# Patient Record
Sex: Male | Born: 1960 | Race: White | Hispanic: No | Marital: Married | Smoking: Never smoker
Health system: Southern US, Community
[De-identification: ages and names within clinical notes are randomized; demographics above are authoritative.]

## PROBLEM LIST (undated history)

## (undated) DIAGNOSIS — Z8719 Personal history of other diseases of the digestive system: Secondary | ICD-10-CM

## (undated) DIAGNOSIS — Z8739 Personal history of other diseases of the musculoskeletal system and connective tissue: Secondary | ICD-10-CM

## (undated) DIAGNOSIS — R51 Headache: Secondary | ICD-10-CM

## (undated) DIAGNOSIS — M1990 Unspecified osteoarthritis, unspecified site: Secondary | ICD-10-CM

## (undated) DIAGNOSIS — Z8711 Personal history of peptic ulcer disease: Secondary | ICD-10-CM

## (undated) HISTORY — PX: SHOULDER SURGERY: SHX246

---

## 2013-07-18 ENCOUNTER — Ambulatory Visit: Payer: Self-pay | Admitting: General Practice

## 2013-08-21 ENCOUNTER — Other Ambulatory Visit (HOSPITAL_COMMUNITY): Payer: Self-pay | Admitting: Orthopaedic Surgery

## 2013-08-22 ENCOUNTER — Encounter (HOSPITAL_COMMUNITY): Payer: Self-pay | Admitting: Pharmacy Technician

## 2013-08-22 ENCOUNTER — Other Ambulatory Visit (HOSPITAL_COMMUNITY): Payer: Self-pay | Admitting: *Deleted

## 2013-08-23 ENCOUNTER — Encounter (HOSPITAL_COMMUNITY): Payer: Self-pay

## 2013-08-23 ENCOUNTER — Encounter (HOSPITAL_COMMUNITY)
Admission: RE | Admit: 2013-08-23 | Discharge: 2013-08-23 | Disposition: A | Discharge status: Home / Self Care | Payer: No Typology Code available for payment source | Source: Ambulatory Visit | Attending: Orthopaedic Surgery | Admitting: Orthopaedic Surgery

## 2013-08-23 DIAGNOSIS — Z01812 Encounter for preprocedural laboratory examination: Secondary | ICD-10-CM | POA: Insufficient documentation

## 2013-08-23 DIAGNOSIS — Z01818 Encounter for other preprocedural examination: Secondary | ICD-10-CM | POA: Insufficient documentation

## 2013-08-23 HISTORY — DX: Personal history of other diseases of the musculoskeletal system and connective tissue: Z87.39

## 2013-08-23 HISTORY — DX: Headache: R51

## 2013-08-23 HISTORY — DX: Personal history of peptic ulcer disease: Z87.11

## 2013-08-23 HISTORY — DX: Unspecified osteoarthritis, unspecified site: M19.90

## 2013-08-23 HISTORY — DX: Personal history of other diseases of the digestive system: Z87.19

## 2013-08-23 LAB — URINALYSIS, ROUTINE W REFLEX MICROSCOPIC
Glucose, UA: NEGATIVE mg/dL
Leukocytes, UA: NEGATIVE
Nitrite: NEGATIVE
Protein, ur: NEGATIVE mg/dL

## 2013-08-23 LAB — ABO/RH: ABO/RH(D): A POS

## 2013-08-23 LAB — BASIC METABOLIC PANEL
GFR calc Af Amer: >90 mL/min (ref >90)
GFR calc non Af Amer: >90 mL/min (ref >90)
Potassium: 4.3 mEq/L (ref 3.5–5.1)
Sodium: 135 mEq/L (ref 135–145)

## 2013-08-23 LAB — CBC
Hemoglobin: 15.5 g/dL (ref 13.0–17.0)
MCHC: 35.1 g/dL (ref 30.0–36.0)
RBC: 5.3 MIL/uL (ref 4.22–5.81)

## 2013-08-23 LAB — APTT: aPTT: 29 seconds (ref 24–37)

## 2013-08-23 LAB — SURGICAL PCR SCREEN: MRSA, PCR: NEGATIVE

## 2013-08-23 LAB — PROTIME-INR: Prothrombin Time: 11.4 seconds — ABNORMAL LOW (ref 11.6–15.2)

## 2013-08-23 NOTE — Patient Instructions (Signed)
Jeff [name]  08/23/2013                           YOUR PROCEDURE IS SCHEDULED ON: 9/ 26/14               PLEASE REPORT TO SHORT STAY CENTER AT :  9:30AM               CALL THIS NUMBER IF ANY PROBLEMS THE DAY OF SURGERY :               [phone]                      REMEMBER:   Do not eat food or drink liquids AFTER MIDNIGHT  May have clear liquids UNTIL 6 HOURS BEFORE SURGERY (6:00 AM)  Clear liquids include soda, tea, black coffee, apple or grape juice, broth.  Take these medicines the morning of surgery with A SIP OF WATER:  NONE   Do not wear jewelry, make-up   Do not wear lotions, powders, or perfumes.   Do not shave legs or underarms 12 hrs. before surgery (men may shave face)  Do not bring valuables to the hospital.  Contacts, dentures or bridgework may not be worn into surgery.  Leave suitcase in the car. After surgery it may be brought to your room.  For patients admitted to the hospital more than one night, checkout time is 11:00                          The day of discharge.   Patients discharged the day of surgery will not be allowed to drive home                             If going home same day of surgery, must have someone stay with you first                           24 hrs at home and arrange for some one to drive you home from hospital.    Special Instructions:   Please read over the following fact sheets that you were given:               1. MRSA  INFORMATION                      2. [place] [facility] PREPARING FOR SURGERY SHEET                                                X_____________________________________________________________________        Failure to follow these instructions may result in cancellation of your surgery

## 2013-08-24 NOTE — Progress Notes (Signed)
Spoke with Carollee Herter at Dr. Alben Spittle office concerning +PCR screen . She will follow up. Also informed PT

## 2013-08-31 ENCOUNTER — Encounter (HOSPITAL_COMMUNITY): Payer: Self-pay | Admitting: *Deleted

## 2013-08-31 ENCOUNTER — Ambulatory Visit (HOSPITAL_COMMUNITY): Payer: No Typology Code available for payment source

## 2013-08-31 ENCOUNTER — Ambulatory Visit (HOSPITAL_COMMUNITY): Payer: No Typology Code available for payment source | Admitting: Anesthesiology

## 2013-08-31 ENCOUNTER — Encounter (HOSPITAL_COMMUNITY): Payer: Self-pay | Admitting: Anesthesiology

## 2013-08-31 ENCOUNTER — Inpatient Hospital Stay (HOSPITAL_COMMUNITY): Payer: No Typology Code available for payment source

## 2013-08-31 ENCOUNTER — Inpatient Hospital Stay (HOSPITAL_COMMUNITY)
Admission: RE | Admit: 2013-08-31 | Discharge: 2013-09-02 | DRG: 470 | Disposition: A | Discharge status: Home Health | Payer: No Typology Code available for payment source | Source: Ambulatory Visit | Attending: Orthopaedic Surgery | Admitting: Orthopaedic Surgery

## 2013-08-31 ENCOUNTER — Encounter (HOSPITAL_COMMUNITY)
Admission: RE | Disposition: A | Discharge status: Home Health | Payer: Self-pay | Source: Ambulatory Visit | Attending: Orthopaedic Surgery

## 2013-08-31 DIAGNOSIS — Z01812 Encounter for preprocedural laboratory examination: Secondary | ICD-10-CM

## 2013-08-31 DIAGNOSIS — M9190 Juvenile osteochondrosis of hip and pelvis, unspecified, unspecified leg: Secondary | ICD-10-CM | POA: Diagnosis present

## 2013-08-31 DIAGNOSIS — M169 Osteoarthritis of hip, unspecified: Secondary | ICD-10-CM

## 2013-08-31 DIAGNOSIS — M1610 Unilateral primary osteoarthritis, unspecified hip: Principal | ICD-10-CM | POA: Diagnosis present

## 2013-08-31 HISTORY — PX: TOTAL HIP ARTHROPLASTY: SHX124

## 2013-08-31 LAB — TYPE AND SCREEN: ABO/RH(D): A POS

## 2013-08-31 SURGERY — ARTHROPLASTY, HIP, TOTAL, ANTERIOR APPROACH
Anesthesia: Spinal | Site: Hip | Laterality: Left | Wound class: Clean

## 2013-08-31 MED ORDER — HYDROMORPHONE HCL PF 1 MG/ML IJ SOLN
1.0000 mg | INTRAMUSCULAR | Status: DC | PRN
  Administered 2013-08-31 (×2): 1 mg via INTRAVENOUS
  Filled 2013-08-31 (×2): qty 1

## 2013-08-31 MED ORDER — ACETAMINOPHEN 650 MG RE SUPP: 650.0000 mg | Freq: Four times a day (QID) | RECTAL | Status: DC | PRN

## 2013-08-31 MED ORDER — METHOCARBAMOL 100 MG/ML IJ SOLN
500.0000 mg | Freq: Four times a day (QID) | INTRAVENOUS | Status: DC | PRN
  Administered 2013-08-31: 500 mg via INTRAVENOUS
  Filled 2013-08-31: qty 5

## 2013-08-31 MED ORDER — ONDANSETRON HCL 4 MG PO TABS: 4.0000 mg | ORAL_TABLET | Freq: Four times a day (QID) | ORAL | Status: DC | PRN

## 2013-08-31 MED ORDER — ACETAMINOPHEN 500 MG PO TABS
1000.0000 mg | ORAL_TABLET | Freq: Once | ORAL | Status: AC
  Administered 2013-08-31: 1000 mg via ORAL

## 2013-08-31 MED ORDER — CELECOXIB 200 MG PO CAPS
200.0000 mg | ORAL_CAPSULE | Freq: Once | ORAL | Status: AC
  Administered 2013-08-31: 200 mg via ORAL
  Filled 2013-08-31: qty 1

## 2013-08-31 MED ORDER — CEFAZOLIN SODIUM-DEXTROSE 2-3 GM-% IV SOLR
INTRAVENOUS | Status: AC
  Filled 2013-08-31: qty 50

## 2013-08-31 MED ORDER — DOCUSATE SODIUM 100 MG PO CAPS
100.0000 mg | ORAL_CAPSULE | Freq: Two times a day (BID) | ORAL | Status: DC
  Administered 2013-08-31 – 2013-09-02 (×4): 100 mg via ORAL

## 2013-08-31 MED ORDER — METHOCARBAMOL 500 MG PO TABS
500.0000 mg | ORAL_TABLET | Freq: Four times a day (QID) | ORAL | Status: DC | PRN
  Administered 2013-08-31 – 2013-09-02 (×5): 500 mg via ORAL
  Filled 2013-08-31 (×5): qty 1

## 2013-08-31 MED ORDER — PROPOFOL INFUSION 10 MG/ML OPTIME
INTRAVENOUS | Status: DC | PRN
  Administered 2013-08-31: 75 ug/kg/min via INTRAVENOUS

## 2013-08-31 MED ORDER — PROMETHAZINE HCL 25 MG/ML IJ SOLN: 6.2500 mg | INTRAMUSCULAR | Status: DC | PRN

## 2013-08-31 MED ORDER — PHENYLEPHRINE HCL 10 MG/ML IJ SOLN
INTRAMUSCULAR | Status: DC | PRN
  Administered 2013-08-31: 40 ug via INTRAVENOUS

## 2013-08-31 MED ORDER — 0.9 % SODIUM CHLORIDE (POUR BTL) OPTIME
TOPICAL | Status: DC | PRN
  Administered 2013-08-31: 1000 mL

## 2013-08-31 MED ORDER — MORPHINE SULFATE 2 MG/ML IJ SOLN
2.0000 mg | INTRAMUSCULAR | Status: DC | PRN
  Administered 2013-09-01: 4 mg via INTRAVENOUS
  Administered 2013-09-01: 2 mg via INTRAVENOUS
  Administered 2013-09-01: 02:00:00 4 mg via INTRAVENOUS
  Administered 2013-09-01: 2 mg via INTRAVENOUS
  Administered 2013-09-01: 15:00:00 4 mg via INTRAVENOUS
  Filled 2013-08-31 (×2): qty 2
  Filled 2013-08-31: qty 1
  Filled 2013-08-31 (×2): qty 2
  Filled 2013-08-31: qty 1

## 2013-08-31 MED ORDER — CEFAZOLIN SODIUM 1-5 GM-% IV SOLN
1.0000 g | Freq: Four times a day (QID) | INTRAVENOUS | Status: AC
  Administered 2013-08-31 – 2013-09-01 (×2): 1 g via INTRAVENOUS
  Filled 2013-08-31 (×2): qty 50

## 2013-08-31 MED ORDER — METOCLOPRAMIDE HCL 10 MG PO TABS: 5.0000 mg | ORAL_TABLET | Freq: Three times a day (TID) | ORAL | Status: DC | PRN

## 2013-08-31 MED ORDER — ASPIRIN EC 325 MG PO TBEC
325.0000 mg | DELAYED_RELEASE_TABLET | Freq: Two times a day (BID) | ORAL | Status: DC
  Administered 2013-09-01 – 2013-09-02 (×3): 325 mg via ORAL
  Filled 2013-08-31 (×5): qty 1

## 2013-08-31 MED ORDER — METOCLOPRAMIDE HCL 5 MG/ML IJ SOLN
5.0000 mg | Freq: Three times a day (TID) | INTRAMUSCULAR | Status: DC | PRN
  Administered 2013-08-31 – 2013-09-01 (×2): 10 mg via INTRAVENOUS
  Filled 2013-08-31 (×2): qty 2

## 2013-08-31 MED ORDER — VITAMIN D3 25 MCG (1000 UNIT) PO TABS
1000.0000 [IU] | ORAL_TABLET | Freq: Every day | ORAL | Status: DC
  Administered 2013-08-31 – 2013-09-02 (×3): 1000 [IU] via ORAL
  Filled 2013-08-31 (×3): qty 1

## 2013-08-31 MED ORDER — OXYCODONE HCL 5 MG PO TABS
5.0000 mg | ORAL_TABLET | ORAL | Status: DC | PRN
  Administered 2013-08-31 – 2013-09-02 (×9): 10 mg via ORAL
  Filled 2013-08-31 (×9): qty 2

## 2013-08-31 MED ORDER — CEFAZOLIN SODIUM-DEXTROSE 2-3 GM-% IV SOLR
2.0000 g | INTRAVENOUS | Status: AC
  Administered 2013-08-31: 2 g via INTRAVENOUS

## 2013-08-31 MED ORDER — OXYCODONE HCL 5 MG PO TABS: 5.0000 mg | ORAL_TABLET | Freq: Once | ORAL | Status: DC | PRN

## 2013-08-31 MED ORDER — DIPHENHYDRAMINE HCL 12.5 MG/5ML PO ELIX: 12.5000 mg | ORAL_SOLUTION | ORAL | Status: DC | PRN

## 2013-08-31 MED ORDER — SODIUM CHLORIDE 0.9 % IV SOLN
INTRAVENOUS | Status: DC
  Administered 2013-08-31 – 2013-09-01 (×2): via INTRAVENOUS

## 2013-08-31 MED ORDER — MIDAZOLAM HCL 5 MG/5ML IJ SOLN
INTRAMUSCULAR | Status: DC | PRN
  Administered 2013-08-31: 2 mg via INTRAVENOUS

## 2013-08-31 MED ORDER — ACETAMINOPHEN 500 MG PO TABS
ORAL_TABLET | ORAL | Status: AC
  Filled 2013-08-31: qty 2

## 2013-08-31 MED ORDER — ONDANSETRON HCL 4 MG/2ML IJ SOLN
4.0000 mg | Freq: Four times a day (QID) | INTRAMUSCULAR | Status: DC | PRN
  Administered 2013-08-31 – 2013-09-01 (×3): 4 mg via INTRAVENOUS
  Filled 2013-08-31 (×3): qty 2

## 2013-08-31 MED ORDER — POLYETHYLENE GLYCOL 3350 17 G PO PACK: 17.0000 g | PACK | Freq: Every day | ORAL | Status: DC | PRN

## 2013-08-31 MED ORDER — ACETAMINOPHEN 325 MG PO TABS: 650.0000 mg | ORAL_TABLET | Freq: Four times a day (QID) | ORAL | Status: DC | PRN

## 2013-08-31 MED ORDER — TRANEXAMIC ACID 100 MG/ML IV SOLN
1000.0000 mg | INTRAVENOUS | Status: AC
  Administered 2013-08-31: 1000 mg via INTRAVENOUS
  Filled 2013-08-31: qty 10

## 2013-08-31 MED ORDER — OXYCODONE HCL 5 MG/5ML PO SOLN
5.0000 mg | Freq: Once | ORAL | Status: DC | PRN
  Filled 2013-08-31: qty 5

## 2013-08-31 MED ORDER — ALUM & MAG HYDROXIDE-SIMETH 200-200-20 MG/5ML PO SUSP: 30.0000 mL | ORAL | Status: DC | PRN

## 2013-08-31 MED ORDER — MENTHOL 3 MG MT LOZG: 1.0000 | LOZENGE | OROMUCOSAL | Status: DC | PRN

## 2013-08-31 MED ORDER — BUPIVACAINE HCL (PF) 0.5 % IJ SOLN
INTRAMUSCULAR | Status: AC
  Filled 2013-08-31: qty 30

## 2013-08-31 MED ORDER — CELECOXIB 200 MG PO CAPS
ORAL_CAPSULE | ORAL | Status: AC
  Filled 2013-08-31: qty 1

## 2013-08-31 MED ORDER — PHENOL 1.4 % MT LIQD: 1.0000 | OROMUCOSAL | Status: DC | PRN

## 2013-08-31 MED ORDER — ADULT MULTIVITAMIN W/MINERALS CH
1.0000 | ORAL_TABLET | Freq: Every day | ORAL | Status: DC
  Administered 2013-08-31 – 2013-09-02 (×3): 1 via ORAL
  Filled 2013-08-31 (×3): qty 1

## 2013-08-31 MED ORDER — PROPOFOL 10 MG/ML IV BOLUS
INTRAVENOUS | Status: DC | PRN
  Administered 2013-08-31 (×2): 20 mg via INTRAVENOUS

## 2013-08-31 MED ORDER — GABAPENTIN 100 MG PO CAPS
100.0000 mg | ORAL_CAPSULE | Freq: Every day | ORAL | Status: DC
  Administered 2013-08-31 – 2013-09-01 (×2): 100 mg via ORAL
  Filled 2013-08-31 (×3): qty 1

## 2013-08-31 MED ORDER — HYDROMORPHONE HCL PF 1 MG/ML IJ SOLN
INTRAMUSCULAR | Status: AC
  Filled 2013-08-31: qty 1

## 2013-08-31 MED ORDER — FENTANYL CITRATE 0.05 MG/ML IJ SOLN
INTRAMUSCULAR | Status: DC | PRN
  Administered 2013-08-31: 50 ug via INTRAVENOUS

## 2013-08-31 MED ORDER — PHENYLEPHRINE HCL 10 MG/ML IJ SOLN
10.0000 mg | INTRAVENOUS | Status: DC | PRN
  Administered 2013-08-31: 40 ug/min via INTRAVENOUS

## 2013-08-31 MED ORDER — HYDROMORPHONE HCL PF 1 MG/ML IJ SOLN
0.2500 mg | INTRAMUSCULAR | Status: DC | PRN
  Administered 2013-08-31: 0.5 mg via INTRAVENOUS

## 2013-08-31 MED ORDER — BUPIVACAINE HCL (PF) 0.5 % IJ SOLN
INTRAMUSCULAR | Status: DC | PRN
  Administered 2013-08-31: 3 mL

## 2013-08-31 MED ORDER — LACTATED RINGERS IV SOLN
INTRAVENOUS | Status: DC
  Administered 2013-08-31: 13:00:00 via INTRAVENOUS
  Administered 2013-08-31: 1000 mL via INTRAVENOUS

## 2013-08-31 MED ORDER — ZOLPIDEM TARTRATE 5 MG PO TABS: 5.0000 mg | ORAL_TABLET | Freq: Every evening | ORAL | Status: DC | PRN

## 2013-08-31 MED ORDER — MEPERIDINE HCL 50 MG/ML IJ SOLN: 6.2500 mg | INTRAMUSCULAR | Status: DC | PRN

## 2013-08-31 SURGICAL SUPPLY — 41 items
BAG ZIPLOCK 12X15 (MISCELLANEOUS) ×4 IMPLANT
BLADE SAW SGTL 18X1.27X75 (BLADE) ×2 IMPLANT
CAPT HIP PF COP ×2 IMPLANT
CELLS DAT CNTRL 66122 CELL SVR (MISCELLANEOUS) ×1 IMPLANT
CLOTH BEACON ORANGE TIMEOUT ST (SAFETY) ×2 IMPLANT
DERMABOND ADVANCED (GAUZE/BANDAGES/DRESSINGS) ×1
DERMABOND ADVANCED .7 DNX12 (GAUZE/BANDAGES/DRESSINGS) ×1 IMPLANT
DRAPE C-ARM 42X120 X-RAY (DRAPES) ×2 IMPLANT
DRAPE STERI IOBAN 125X83 (DRAPES) ×2 IMPLANT
DRAPE U-SHAPE 47X51 STRL (DRAPES) ×6 IMPLANT
DRSG AQUACEL AG ADV 3.5X10 (GAUZE/BANDAGES/DRESSINGS) ×2 IMPLANT
DRSG TEGADERM 2-3/8X2-3/4 SM (GAUZE/BANDAGES/DRESSINGS) ×2 IMPLANT
DRSG XEROFORM 1X8 (GAUZE/BANDAGES/DRESSINGS) ×2 IMPLANT
DURAPREP 26ML APPLICATOR (WOUND CARE) ×2 IMPLANT
ELECT BLADE TIP CTD 4 INCH (ELECTRODE) ×2 IMPLANT
ELECT REM PT RETURN 9FT ADLT (ELECTROSURGICAL) ×2
ELECTRODE REM PT RTRN 9FT ADLT (ELECTROSURGICAL) ×1 IMPLANT
FACESHIELD LNG OPTICON STERILE (SAFETY) ×8 IMPLANT
GAUZE SPONGE 2X2 8PLY STRL LF (GAUZE/BANDAGES/DRESSINGS) ×1 IMPLANT
GLOVE BIO SURGEON STRL SZ7.5 (GLOVE) ×2 IMPLANT
GLOVE BIOGEL PI IND STRL 8 (GLOVE) ×2 IMPLANT
GLOVE BIOGEL PI INDICATOR 8 (GLOVE) ×2
GLOVE ECLIPSE 8.0 STRL XLNG CF (GLOVE) ×2 IMPLANT
GOWN STRL REIN XL XLG (GOWN DISPOSABLE) ×4 IMPLANT
HANDPIECE INTERPULSE COAX TIP (DISPOSABLE) ×1
KIT BASIN OR (CUSTOM PROCEDURE TRAY) ×2 IMPLANT
PACK TOTAL JOINT (CUSTOM PROCEDURE TRAY) ×2 IMPLANT
PADDING CAST COTTON 6X4 STRL (CAST SUPPLIES) ×2 IMPLANT
RTRCTR WOUND ALEXIS 18CM MED (MISCELLANEOUS) ×2
SET HNDPC FAN SPRY TIP SCT (DISPOSABLE) ×1 IMPLANT
SPONGE GAUZE 2X2 STER 10/PKG (GAUZE/BANDAGES/DRESSINGS) ×1
SUT ETHIBOND NAB CT1 #1 30IN (SUTURE) ×4 IMPLANT
SUT ETHILON 3 0 PS 1 (SUTURE) ×2 IMPLANT
SUT MNCRL AB 4-0 PS2 18 (SUTURE) ×2 IMPLANT
SUT VIC AB 0 CT1 36 (SUTURE) ×2 IMPLANT
SUT VIC AB 1 CT1 36 (SUTURE) ×4 IMPLANT
SUT VIC AB 2-0 CT1 27 (SUTURE) ×2
SUT VIC AB 2-0 CT1 TAPERPNT 27 (SUTURE) ×2 IMPLANT
TOWEL OR 17X26 10 PK STRL BLUE (TOWEL DISPOSABLE) ×2 IMPLANT
TOWEL OR NON WOVEN STRL DISP B (DISPOSABLE) ×2 IMPLANT
TRAY FOLEY CATH 14FRSI W/METER (CATHETERS) ×2 IMPLANT

## 2013-08-31 NOTE — H&P (Signed)
TOTAL HIP ADMISSION H&P  Patient is admitted for left total hip arthroplasty.  Subjective:  Chief Complaint: left hip pain  HPI: Jeff [name], 52 y.o. male, has a history of pain and functional disability in the left hip(s) due to arthritis and patient has failed non-surgical conservative treatments for greater than 12 weeks to include NSAID's and/or analgesics, flexibility and strengthening excercises, weight reduction as appropriate and activity modification.  Onset of symptoms was gradual starting 5 years ago with gradually worsening course since that time.The patient noted no past surgery on the left hip(s).  Patient currently rates pain in the left hip at 8 out of 10 with activity. Patient has night pain, worsening of pain with activity and weight bearing, trendelenberg gait, pain that interfers with activities of daily living and pain with passive range of motion. Patient has evidence of subchondral sclerosis, periarticular osteophytes and joint space narrowing by imaging studies. This condition presents safety issues increasing the risk of falls.  There is no current active infection.  Patient Active Problem List   Diagnosis Date Noted  . Degenerative arthritis of left hip 08/31/2013   Past Medical History  Diagnosis Date  . Headache(784.0)   . Arthritis   . Hx of gout   . History of stomach ulcers     AGE 7    Past Surgical History  Procedure Laterality Date  . Shoulder surgery  28 YRS AGO    No prescriptions prior to admission   Allergies  Allergen Reactions  . Ibuprofen Itching    History  Substance Use Topics  . Smoking status: Never Smoker   . Smokeless tobacco: Not on file  . Alcohol Use: Yes     Comment: OCCASIONAL    No family history on file.   Review of Systems  Musculoskeletal: Positive for joint pain.  All other systems reviewed and are negative.    Objective:  Physical Exam  Constitutional: He is oriented to person, place, and time. He appears  well-developed and well-nourished.  HENT:  Head: Normocephalic and atraumatic.  Eyes: EOM are normal. Pupils are equal, round, and reactive to light.  Neck: Normal range of motion. Neck supple.  Cardiovascular: Normal rate and regular rhythm.   Respiratory: Effort normal and breath sounds normal.  GI: Soft. Bowel sounds are normal.  Musculoskeletal:       Left hip: He exhibits decreased range of motion, decreased strength and bony tenderness.  Neurological: He is alert and oriented to person, place, and time.  Skin: Skin is warm and dry.  Psychiatric: He has a normal mood and affect.    Vital signs in last 24 hours:    Labs:   There is no height or weight on file to calculate BMI.   Imaging Review Plain radiographs demonstrate severe degenerative joint disease of the left hip(s). The bone quality appears to be good for age and reported activity level.  Assessment/Plan:  End stage arthritis, left hip(s)  The patient history, physical examination, clinical judgement of the provider and imaging studies are consistent with end stage degenerative joint disease of the left hip(s) and total hip arthroplasty is deemed medically necessary. The treatment options including medical management, injection therapy, arthroscopy and arthroplasty were discussed at length. The risks and benefits of total hip arthroplasty were presented and reviewed. The risks due to aseptic loosening, infection, stiffness, dislocation/subluxation,  thromboembolic complications and other imponderables were discussed.  The patient acknowledged the explanation, agreed to proceed with the plan and consent  was signed. Patient is being admitted for inpatient treatment for surgery, pain control, PT, OT, prophylactic antibiotics, VTE prophylaxis, progressive ambulation and ADL's and discharge planning.The patient is planning to be discharged home with home health services

## 2013-08-31 NOTE — Anesthesia Procedure Notes (Addendum)
Spinal  Patient location during procedure: OR Start time: 08/31/2013 12:03 PM End time: 08/31/2013 12:07 PM Staffing CRNA/Resident: Paris Lore Performed by: resident/CRNA  Preanesthetic Checklist Completed: patient identified, site marked, surgical consent, pre-op evaluation, timeout performed, IV checked, risks and benefits discussed and monitors and equipment checked Spinal Block Patient position: sitting Prep: ChloraPrep Patient monitoring: heart rate, continuous pulse ox and blood pressure Approach: right paramedian Location: L2-3 Injection technique: single-shot Needle Needle type: Sprotte  Needle gauge: 24 G Needle length: 9 cm Needle insertion depth: 5 cm Assessment Sensory level: T4 Additional Notes Expiration date of kit checked and confirmed. Patient tolerated procedure well, without complications.

## 2013-08-31 NOTE — Transfer of Care (Signed)
Immediate Anesthesia Transfer of Care Note  Patient: Jeff [name]  Procedure(s) Performed: Procedure(s) (LRB): LEFT TOTAL HIP ARTHROPLASTY ANTERIOR APPROACH (Left)  Patient Location: PACU  Anesthesia Type: Spinal  Level of Consciousness: sedated, patient cooperative and responds to stimulation  Airway & Oxygen Therapy: Patient Spontanous Breathing and Patient connected to face mask oxgen  Post-op Assessment: Report given to PACU RN and Post -op [name] signs reviewed and stable  Post [name] signs: Reviewed and stable T-12 level on exam pt denied pain on assessment.   Complications: No apparent anesthesia complications

## 2013-08-31 NOTE — Brief Op Note (Signed)
08/31/2013  2:01 PM  PATIENT:  Jeff [name]  52 y.o. male  PRE-OPERATIVE DIAGNOSIS:  Left hip osteoarthritis  POST-OPERATIVE DIAGNOSIS:  Left hip osteoarthritis  PROCEDURE:  Procedure(s): LEFT TOTAL HIP ARTHROPLASTY ANTERIOR APPROACH (Left)  SURGEON:  Surgeon(s) and Role:    * Kathryne Hitch, MD - Primary  PHYSICIAN ASSISTANT: Rexene Edison, PA-C  ANESTHESIA:   spinal  EBL:  Total I/O In: 1000 [I.V.:1000] Out: 300 [Urine:100; Blood:200]  BLOOD ADMINISTERED:none  DRAINS:  Medium hemovac  LOCAL MEDICATIONS USED:  NONE  SPECIMEN:  No Specimen  DISPOSITION OF SPECIMEN:  N/A  COUNTS:  YES  TOURNIQUET:  * No tourniquets in log *  DICTATION: .Other Dictation: Dictation Number [id number]  PLAN OF CARE: Admit to inpatient   PATIENT DISPOSITION:  PACU - hemodynamically stable.   Delay start of Pharmacological VTE agent (>24hrs) due to surgical blood loss or risk of bleeding: no

## 2013-08-31 NOTE — Anesthesia Postprocedure Evaluation (Signed)
Anesthesia Post Note  Patient: Jeff [name]  Procedure(s) Performed: Procedure(s) (LRB): LEFT TOTAL HIP ARTHROPLASTY ANTERIOR APPROACH (Left)  Anesthesia type: Spinal  Patient location: PACU  Post pain: Pain level controlled  Post assessment: Post-op Vital signs reviewed  Last Vitals: BP 109/74  Pulse 75  Temp(Src) 35.9 C (Axillary)  Resp 16  Ht 5\' 8"  (1.727 m)  Wt 188 lb 6 oz (85.446 kg)  BMI 28.65 kg/m2  SpO2 97%  Post vital signs: Reviewed  Level of consciousness: sedated  Complications: No apparent anesthesia complications

## 2013-08-31 NOTE — Anesthesia Preprocedure Evaluation (Addendum)
Anesthesia Evaluation  Patient identified by MRN, date of birth, ID band Patient awake    Reviewed: Allergy & Precautions, H&P , NPO status , Patient's Chart, lab work & pertinent test results  Airway Mallampati: II TM Distance: >3 FB Neck ROM: Full    Dental  (+) Dental Advisory Given and Caps   Pulmonary neg pulmonary ROS,  breath sounds clear to auscultation        Cardiovascular negative cardio ROS  Rhythm:Regular Rate:Normal     Neuro/Psych  Headaches, negative psych ROS   GI/Hepatic negative GI ROS, Neg liver ROS,   Endo/Other  negative endocrine ROS  Renal/GU negative Renal ROS     Musculoskeletal negative musculoskeletal ROS (+)   Abdominal   Peds  Hematology negative hematology ROS (+)   Anesthesia Other Findings   Reproductive/Obstetrics                          Anesthesia Physical Anesthesia Plan  ASA: I  Anesthesia Plan: Spinal   Post-op Pain Management:    Induction:   Airway Management Planned:   Additional Equipment:   Intra-op Plan:   Post-operative Plan:   Informed Consent: I have reviewed the patients History and Physical, chart, labs and discussed the procedure including the risks, benefits and alternatives for the proposed anesthesia with the patient or authorized representative who has indicated his/her understanding and acceptance.   Dental advisory given  Plan Discussed with: CRNA  Anesthesia Plan Comments:         Anesthesia Quick Evaluation

## 2013-09-01 LAB — BASIC METABOLIC PANEL
BUN: 10 mg/dL (ref 6–23)
CO2: 26 mEq/L (ref 19–32)
Calcium: 8.7 mg/dL (ref 8.4–10.5)
Chloride: 94 mEq/L — ABNORMAL LOW (ref 96–112)
Creatinine, Ser: 0.73 mg/dL (ref 0.50–1.35)
Glucose, Bld: 159 mg/dL — ABNORMAL HIGH (ref 70–99)
Potassium: 3.9 mEq/L (ref 3.5–5.1)

## 2013-09-01 LAB — CBC
HCT: 35.5 % — ABNORMAL LOW (ref 39.0–52.0)
Hemoglobin: 12.2 g/dL — ABNORMAL LOW (ref 13.0–17.0)
MCV: 83.5 fL (ref 78.0–100.0)
RDW: 12.9 % (ref 11.5–15.5)
WBC: 9.3 10*3/uL (ref 4.0–10.5)

## 2013-09-01 NOTE — Progress Notes (Signed)
OT Cancellation Note and Discharge  Patient Details Name: Jeff [name] MRN: [id number] DOB: 1961/09/04   Cancelled Treatment:    Reason Eval/Treat Not Completed: OT screened, no needs identified, will sign off. Pt direct anterior approach hip, WBAT, has 24/7 family support, has DME.   Evette Georges [phone] 09/01/2013, 11:53 AM

## 2013-09-01 NOTE — Op Note (Signed)
NAMEDREVIN, ORTNER NO.:  [id number]  MEDICAL RECORD NO.:  [id number]  LOCATION:  1609                         FACILITY:  [facility]  PHYSICIAN:  Vanita Panda. 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  PROCEDURE:  Left total hip arthroplasty through direct anterior approach.  IMPLANTS:  DePuy Sector Gription acetabular component size 52 with apex eliminator guide and 1 screw, size 36+ 4 neutral polyethylene liner, size 9 Corail femoral component with standard offset, size 36+ 5 ceramic hip ball.  SURGEON:  Vanita Panda. Magnus Ivan, M.D.  ASSISTANT:  Richardean Canal, PA-C  ANESTHESIA:  Spinal.  ANTIBIOTICS:  2 g IV Ancef.  BLOOD LOSS:  Less than 500 mL.  COMPLICATIONS:  None.  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 He has gotten to where his hip is much shorter on the left than the right.  He has significant limp.  He has daily pain and decrease in activities and at this point, he wished to proceed with a total hip arthroplasty.  I feel confident that we can get this done through a direct anterior approach.  I have explained the risks and benefits of the surgery in detail including the risks of fracture, acute blood loss anemia, nerve and vessel injury, infection, and DVT.  He does wish to proceed with surgery with the goals of decreased pain, increased mobility, and increased quality of life.  PROCEDURE DESCRIPTION:  After informed  consent was obtained, appropriate left hip was marked.  He was brought to the operating room and spinal anesthesia was obtained while he was on the stretcher.  He was then placed supine back on a stretcher.  Foley catheter was placed and then traction boots were placed on both his feet.  Next, he was placed supine on the Hana fracture table with perineal post in place and both legs in inline skeletal traction devices, but no traction applied.  His left operative hip was prepped and draped with DuraPrep and sterile drapes. Time-out was called.  He was identified as correct patient, correct left hip.  We then made an incision just inferior and posterior to the anterior and superior iliac spine and carried this obliquely down the leg.  We dissected down to the tensor fascia lata muscle and the tensor fascia was divided longitudinally.  We then proceeded with a direct anterior approach to the hip.  Curved retractor was placed around the lateral neck with a short neck and 1 underneath the medial neck. I cauterized the lateral femoral circumflex vessels and then opened up hip capsule in a L-type format, retractors placing the within the hip capsule.  I then used  oscillating saw to make my femoral  neck cut barely proximal to the lesser trochanter.  We then completed this with an osteotome and I placed a corkscrew guide in the femoral head and removed the femoral head in its entirety and found a devoid of cartilage.  We then placed a bent Hohmann medially and a Cobra retractor laterally and cleaned the remnants with the acetabular labrum and tissue within the acetabulum.  We then began reaming with medializing and a superior position of the cup to get good bony rim contact.  We reamed in 2 mm increments under direct visualization from size 42 up to size 52 with a last reamer also placed under direct fluoroscopy, so we could obtain our depth of reaming, our inclination and anteversion.  Once  I was pleased with this, I placed the real DePuy Sector Gription acetabular component size 52 and the apex hole eliminator guide, as well as a single screw.  I then turned attention to the femur with the leg externally rotated to 100 degrees, extended and adducted.  I placed a Mueller, retractored medially and Hohmann retractor behind the greater trochanter and used a box cutting osteotome to enter the femoral canal and a rongeur to lateralize.  I then broached with only a size 8 and then a size 9 broach.  We then trialed a standard neck and a 36+ 5 hip ball hoping to gain some length on him because he was about 0.25 inch shorter preop.  We were able to bring the leg back up and overall traction and internal rotation reducing the pelvis and it was stable past 90 degrees of external rotation, 45 degrees of internal rotation with minimal shuck and we are able to accomplish increasing his leg length.  We then dislocated the hip and removed the trial components and placed the real size 9 femoral component, which was Corail from DePuy along with a 36+ 5 ceramic hip ball.  We reduced this back in the acetabulum again it was stable so we copiously irrigated the soft tissue with normal saline solution.  We closed the joint capsule with interrupted #1 Ethibond suture.  I placed a medium Hemovac deep in the arthrotomy and closed the tensor fascia with a running #1 Vicryl suture, followed by 0 Vicryl in the deep tissue, 2-0 Vicryl subcutaneous tissue and staples on the skin, Xeroform.  A well-padded sterile dressing was applied.  He was taken off the Hana table into the recovery room in stable condition.  All final counts were correct.  There were no complications noted.     Vanita Panda. Magnus Ivan, M.D.     CYB/MEDQ  D:  08/31/2013  T:  09/01/2013  Job:  [id number]

## 2013-09-01 NOTE — Plan of Care (Signed)
Problem: Consults Goal: Diagnosis- Total Joint Replacement Outcome: Completed/Met Date Met:  09/01/13 Primary Total Hip LEFT, Anterior

## 2013-09-01 NOTE — Evaluation (Signed)
Physical Therapy Evaluation Patient Details Name: Jeff [name] MRN: [id number] DOB: 17-Feb-1961 Today's Date: 09/01/2013 Time: 4098-1191 PT Time Calculation (min): 18 min  PT Assessment / Plan / Recommendation History of Present Illness  52 yo male s/p L THA-direct anterior. Hx gout  Clinical Impression  On eval, pt required Min assist for mobility-able to ambulate ~75 feet with RW. Anticipate pt will progress well during stay. Recommend HHPT.     PT Assessment  Patient needs continued PT services    Follow Up Recommendations  Home health PT    Does the patient have the potential to tolerate intense rehabilitation      Barriers to Discharge        Equipment Recommendations  None recommended by PT    Recommendations for Other Services OT consult   Frequency 7X/week    Precautions / Restrictions Precautions Precautions: None Restrictions Weight Bearing Restrictions: No LLE Weight Bearing: Weight bearing as tolerated   Pertinent Vitals/Pain L LE 7/10. Ice applied end of session      Mobility  Bed Mobility Bed Mobility: Supine to Sit Supine to Sit: 4: Min assist Details for Bed Mobility Assistance: Assist for L LE Transfers Transfers: Sit to Stand;Stand to Sit Sit to Stand: 4: Min assist;From bed Stand to Sit: 4: Min assist;To chair/3-in-1 Details for Transfer Assistance: assist to rise, stabilize contol descent.  Ambulation/Gait Ambulation/Gait Assistance: 4: Min assist Ambulation Distance (Feet): 75 Feet Assistive device: Rolling walker Ambulation/Gait Assistance Details: slow gait speed. VCs L step length, sequence, safety.  Gait Pattern: Step-to pattern;Decreased step length - left;Antalgic    Exercises     PT Diagnosis: Difficulty walking;Abnormality of gait;Acute pain  PT Problem List: Decreased strength;Decreased range of motion;Decreased activity tolerance;Decreased mobility;Pain;Decreased knowledge of use of DME PT Treatment Interventions: DME  instruction;Gait training;Stair training;Functional mobility training;Therapeutic activities;Therapeutic exercise;Patient/family education     PT Goals(Current goals can be found in the care plan section) Acute Rehab PT Goals Patient Stated Goal: less pain. regain indepdendence PT Goal Formulation: With patient Time For Goal Achievement: 09/08/13 Potential to Achieve Goals: Good  Visit Information  Last PT Received On: 09/01/13 Assistance Needed: +1 History of Present Illness: 52 yo male s/p L THA-direct anterior. Hx gout       Prior Functioning  Home Living Family/patient expects to be discharged to:: Private residence Living Arrangements: Spouse/significant other Available Help at Discharge: Family Type of Home: House Home Access: Stairs to enter [employer] of Steps: 2 Entrance Stairs-Rails: None Home Layout: Two level;Able to live on main level with bedroom/bathroom Home Equipment: Dan Humphreys - 2 wheels;Cane - single point Additional Comments: wife can get [facility] if needed Prior Function Level of Independence: Independent Communication Communication: No difficulties    Cognition  Cognition Arousal/Alertness: Awake/alert Behavior During Therapy: WFL for tasks assessed/performed Overall Cognitive Status: Within Functional Limits for tasks assessed    Extremity/Trunk Assessment Upper Extremity Assessment Upper Extremity Assessment: Overall WFL for tasks assessed Lower Extremity Assessment Lower Extremity Assessment: LLE deficits/detail LLE Deficits / Details: hip flex 2/5, moves ankle well Cervical / Trunk Assessment Cervical / Trunk Assessment: Normal   Balance    End of Session PT - End of Session Equipment Utilized During Treatment: Gait belt Activity Tolerance: Patient tolerated treatment well Patient left: in chair;with call bell/phone within reach;with family/visitor present  GP     Rebeca Alert, MPT Pager: [phone]

## 2013-09-01 NOTE — Progress Notes (Signed)
Subjective: 1 Day Post-Op Procedure(s) (LRB): LEFT TOTAL HIP ARTHROPLASTY ANTERIOR APPROACH (Left) Patient reports pain as moderate.    Objective: Vital signs in last 24 hours: Temp:  [96.6 F (35.9 C)-99.8 F (37.7 C)] 98.6 F (37 C) (09/27 1414) Pulse Rate:  [64-88] 73 (09/27 1414) Resp:  [12-18] 16 (09/27 1414) BP: (107-139)/(62-87) 139/87 mmHg (09/27 1414) SpO2:  [95 %-100 %] 100 % (09/27 1414) Weight:  [85.446 kg (188 lb 6 oz)] 85.446 kg (188 lb 6 oz) (09/26 1900)  Intake/Output from previous day: 09/26 0701 - 09/27 0700 In: 3997.5 [P.O.:480; I.V.:3362.5; IV Piggyback:155] Out: 2205 [Urine:1900; Drains:105; Blood:200] Intake/Output this shift: Total I/O In: 880 [P.O.:580; I.V.:300] Out: -    Recent Labs  09/01/13 0512  HGB 12.2*    Recent Labs  09/01/13 0512  WBC 9.3  RBC 4.25  HCT 35.5*  PLT 212    Recent Labs  09/01/13 0512  NA 132*  K 3.9  CL 94*  CO2 26  BUN 10  CREATININE 0.73  GLUCOSE 159*  CALCIUM 8.7   No results found for this basename: LABPT, INR,  in the last 72 hours  Sensation intact distally Intact pulses distally Dorsiflexion/Plantar flexion intact Incision: dressing C/D/I  Assessment/Plan: 1 Day Post-Op Procedure(s) (LRB): LEFT TOTAL HIP ARTHROPLASTY ANTERIOR APPROACH (Left) Up with therapy  Maxima Skelton Y 09/01/2013, 2:47 PM

## 2013-09-01 NOTE — Care Management Note (Signed)
    Page 1 of 1   09/01/2013     4:49:28 PM   CARE MANAGEMENT NOTE 09/01/2013  Patient:  Jeff [name], Jeff [name]   Account Number:  [id number]  Date Initiated:  09/01/2013  Documentation initiated by:  Lanier Clam  Subjective/Objective Assessment:   7O Y/O M ADMITTED W/OA L HIP     Action/Plan:   FROM HOME   Anticipated DC Date:  09/02/2013   Anticipated DC Plan:  HOME W HOME HEALTH SERVICES      DC Planning Services  CM consult      Choice offered to / List presented to:  C-1 Patient           Status of service:  In process, will continue to follow Medicare Important Message given?   (If response is "NO", the following Medicare IM given date fields will be blank) Date Medicare IM given:   Date Additional Medicare IM given:    Discharge Disposition:    Per UR Regulation:    If discussed at Long Length of Stay Meetings, dates discussed:    Comments:  09/01/13 Adaline Trejos RN,BSN NCM [phone] [facility] REP [facility] TEL[address][phone] ALREADY FOLLOWING.PT-HH.AWAIT FINAL HH ORDERS.

## 2013-09-01 NOTE — Progress Notes (Signed)
Physical Therapy Treatment Patient Details Name: Jeff [name] MRN: [id number] DOB: 1961-03-10 Today's Date: 09/01/2013 Time: 6295-2841 PT Time Calculation (min): 24 min  PT Assessment / Plan / Recommendation  History of Present Illness 52 yo male s/p L THA-direct anterior. Hx gout   PT Comments   Progressing with mobility.   Follow Up Recommendations  Home health PT     Does the patient have the potential to tolerate intense rehabilitation     Barriers to Discharge        Equipment Recommendations  None recommended by PT    Recommendations for Other Services OT consult  Frequency 7X/week   Progress towards PT Goals Progress towards PT goals: Progressing toward goals  Plan Current plan remains appropriate    Precautions / Restrictions Precautions Precautions: None Restrictions Weight Bearing Restrictions: No LLE Weight Bearing: Weight bearing as tolerated   Pertinent Vitals/Pain 7/10 L hip. Ice applied end of session    Mobility  Bed Mobility Bed Mobility: Supine to Sit;Sit to Supine Supine to Sit: 4: Min guard Sit to Supine: 4: Min assist Details for Bed Mobility Assistance: Assist for L LE Transfers Transfers: Sit to Stand;Stand to Sit Sit to Stand: 4: Min guard;From bed Stand to Sit: 4: Min guard;To bed Details for Transfer Assistance: VCs safety,. Ambulation/Gait Ambulation/Gait Assistance: 4: Min guard Ambulation Distance (Feet): 150 Feet Assistive device: Rolling walker Ambulation/Gait Assistance Details: vcs safety, sequencing initially. pt progressed to step through gait pattern.  Gait Pattern: Step-through pattern;Decreased stride length;Decreased step length - left;Antalgic    Exercises Total Joint Exercises Ankle Circles/Pumps: AROM;Both;15 reps;Supine Quad Sets: AROM;Both;15 reps;Supine Heel Slides: AROM;Left;15 reps;Supine Hip ABduction/ADduction: AROM;AAROM;Left;15 reps;Supine   PT Diagnosis: Difficulty walking;Abnormality of gait;Acute pain   PT Problem List: Decreased strength;Decreased range of motion;Decreased activity tolerance;Decreased mobility;Pain;Decreased knowledge of use of DME PT Treatment Interventions: DME instruction;Gait training;Stair training;Functional mobility training;Therapeutic activities;Therapeutic exercise;Patient/family education   PT Goals (current goals can now be found in the care plan section) Acute Rehab PT Goals Patient Stated Goal: less pain. regain indepdendence PT Goal Formulation: With patient Time For Goal Achievement: 09/08/13 Potential to Achieve Goals: Good  Visit Information  Last PT Received On: 09/01/13 Assistance Needed: +1 History of Present Illness: 52 yo male s/p L THA-direct anterior. Hx gout    Subjective Data  Patient Stated Goal: less pain. regain indepdendence   Cognition  Cognition Arousal/Alertness: Awake/alert Behavior During Therapy: WFL for tasks assessed/performed Overall Cognitive Status: Within Functional Limits for tasks assessed    Balance     End of Session PT - End of Session Equipment Utilized During Treatment: Gait belt Activity Tolerance: Patient tolerated treatment well Patient left: in bed;with call bell/phone within reach;with family/visitor present   GP     Rebeca Alert, MPT Pager: [phone]

## 2013-09-02 LAB — CBC
HCT: 35.5 % — ABNORMAL LOW (ref 39.0–52.0)
Hemoglobin: 12.3 g/dL — ABNORMAL LOW (ref 13.0–17.0)
MCH: 29 pg (ref 26.0–34.0)
MCHC: 34.6 g/dL (ref 30.0–36.0)
MCV: 83.7 fL (ref 78.0–100.0)
RDW: 12.9 % (ref 11.5–15.5)
WBC: 9.8 10*3/uL (ref 4.0–10.5)

## 2013-09-02 MED ORDER — ASPIRIN 325 MG PO TBEC: 325.0000 mg | DELAYED_RELEASE_TABLET | Freq: Two times a day (BID) | ORAL | Status: AC

## 2013-09-02 MED ORDER — METHOCARBAMOL 500 MG PO TABS: 500.0000 mg | ORAL_TABLET | Freq: Four times a day (QID) | ORAL | Status: DC | PRN

## 2013-09-02 MED ORDER — OXYCODONE-ACETAMINOPHEN 5-325 MG PO TABS: 1.0000 | ORAL_TABLET | ORAL | Status: DC | PRN

## 2013-09-02 NOTE — Progress Notes (Signed)
Physical Therapy Treatment Patient Details Name: Jeff [name] MRN: [id number] DOB: 1961-11-28 Today's Date: 09/02/2013 Time: 2130-8657 PT Time Calculation (min): 25 min  PT Assessment / Plan / Recommendation  History of Present Illness 52 yo male s/p L THA-direct anterior. Hx gout   PT Comments   Progressing with mobility. Practiced ambulation, stair negotiation, standing exercises. Plan is for d/c home today. Ready from PT standpoint.   Follow Up Recommendations  Home health PT     Does the patient have the potential to tolerate intense rehabilitation     Barriers to Discharge        Equipment Recommendations  None recommended by PT    Recommendations for Other Services OT consult  Frequency 7X/week   Progress towards PT Goals Progress towards PT goals: Progressing toward goals  Plan Current plan remains appropriate    Precautions / Restrictions Precautions Precautions: None Restrictions Weight Bearing Restrictions: No LLE Weight Bearing: Weight bearing as tolerated   Pertinent Vitals/Pain 3/10 L LE. Ice applied end of session    Mobility  Bed Mobility Bed Mobility: Not assessed Details for Bed Mobility Assistance: pt sitting in recliner Transfers Transfers: Sit to Stand;Stand to Sit Sit to Stand: 5: Supervision;From chair/3-in-1 Stand to Sit: 5: Supervision;To chair/3-in-1 Ambulation/Gait Ambulation/Gait Assistance: 5: Supervision Ambulation Distance (Feet): 175 Feet Assistive device: Rolling walker Gait Pattern: Step-through pattern;Antalgic;Decreased stride length Stairs: Yes Stairs Assistance: 4: Min assist Stairs Assistance Details (indicate cue type and reason): VCs safety, technique, sequence. 1 HHA from therapist to provide support. "up with good, down with bad"  Stair Management Technique: Forwards;Step to pattern Number of Stairs: 2    Exercises Total Joint Exercises Hip ABduction/ADduction: AROM;10 reps;Left;Standing Long Arc Quad: AROM;10  reps;Seated;Left Knee Flexion: AROM;Left;10 reps;Standing Standing Hip Extension: AROM;Left;10 reps;Standing General Exercises - Lower Extremity Hip Flexion/Marching: AROM;Left;10 reps;Standing Heel Raises: AROM;Left;10 reps;Standing   PT Diagnosis:    PT Problem List:   PT Treatment Interventions:     PT Goals (current goals can now be found in the care plan section)    Visit Information  Last PT Received On: 09/02/13 Assistance Needed: +1 History of Present Illness: 52 yo male s/p L THA-direct anterior. Hx gout    Subjective Data      Cognition  Cognition Arousal/Alertness: Awake/alert Behavior During Therapy: WFL for tasks assessed/performed Overall Cognitive Status: Within Functional Limits for tasks assessed    Balance     End of Session PT - End of Session Activity Tolerance: Patient tolerated treatment well Patient left: in chair;with call bell/phone within reach   GP     Rebeca Alert, MPT Pager: [phone]

## 2013-09-02 NOTE — Progress Notes (Signed)
Subjective: 2 Days Post-Op Procedure(s) (LRB): LEFT TOTAL HIP ARTHROPLASTY ANTERIOR APPROACH (Left) Patient reports pain as mild.    Objective: Vital signs in last 24 hours: Temp:  [98.6 F (37 C)-99.8 F (37.7 C)] 99.7 F (37.6 C) (09/28 0500) Pulse Rate:  [73-102] 102 (09/28 0500) Resp:  [16-18] 18 (09/28 0500) BP: (120-139)/(76-87) 120/79 mmHg (09/28 0500) SpO2:  [99 %-100 %] 100 % (09/28 0500)  Intake/Output from previous day: 09/27 0701 - 09/28 0700 In: 1798.9 [P.O.:1020; I.V.:778.9] Out: -  Intake/Output this shift:     Recent Labs  09/01/13 0512 09/02/13 0528  HGB 12.2* 12.3*    Recent Labs  09/01/13 0512 09/02/13 0528  WBC 9.3 9.8  RBC 4.25 4.24  HCT 35.5* 35.5*  PLT 212 216    Recent Labs  09/01/13 0512  NA 132*  K 3.9  CL 94*  CO2 26  BUN 10  CREATININE 0.73  GLUCOSE 159*  CALCIUM 8.7   No results found for this basename: LABPT, INR,  in the last 72 hours  Sensation intact distally Intact pulses distally Dorsiflexion/Plantar flexion intact Incision: dressing C/D/I  Assessment/Plan: 2 Days Post-Op Procedure(s) (LRB): LEFT TOTAL HIP ARTHROPLASTY ANTERIOR APPROACH (Left) Up with therapy Discharge to home today.  BLACKMAN,CHRISTOPHER Y 09/02/2013, 10:10 AM

## 2013-09-02 NOTE — Progress Notes (Signed)
Discharged from floor via w/c, wife with pt. No changes in assessment. Marley Charlot  

## 2013-09-02 NOTE — Discharge Summary (Signed)
Patient ID: Jeff [name] MRN: [id number] DOB/AGE: 01/15/1961 52 y.o.  Admit date: 08/31/2013 Discharge date: 09/02/2013  Admission Diagnoses:  Principal Problem:   Degenerative arthritis of left hip   Discharge Diagnoses:  Same  Past Medical History  Diagnosis Date  . Headache(784.0)   . Arthritis   . Hx of gout   . History of stomach ulcers     AGE 10    Surgeries: Procedure(s): LEFT TOTAL HIP ARTHROPLASTY ANTERIOR APPROACH on 08/31/2013   Consultants:    Discharged Condition: Improved  Hospital Course: Jeff [name] is an 53 y.o. male who was admitted 08/31/2013 for operative treatment ofDegenerative arthritis of hip. Patient has severe unremitting pain that affects sleep, daily activities, and work/hobbies. After pre-op clearance the patient was taken to the operating room on 08/31/2013 and underwent  Procedure(s): LEFT TOTAL HIP ARTHROPLASTY ANTERIOR APPROACH.    Patient was given perioperative antibiotics: Anti-infectives   Start     Dose/Rate Route Frequency Ordered Stop   08/31/13 1800  ceFAZolin (ANCEF) IVPB 1 g/50 mL premix     1 g 100 mL/hr over 30 Minutes Intravenous Every 6 hours 08/31/13 1554 09/01/13 0055   08/31/13 1037  ceFAZolin (ANCEF) IVPB 2 g/50 mL premix     2 g 100 mL/hr over 30 Minutes Intravenous On call to O.R. 08/31/13 1037 08/31/13 1229       Patient was given sequential compression devices, early ambulation, and chemoprophylaxis to prevent DVT.  Patient benefited maximally from hospital stay and there were no complications.    Recent vital signs: Patient Vitals for the past 24 hrs:  BP Temp Temp src Pulse Resp SpO2  09/02/13 0500 120/79 mmHg 99.7 F (37.6 C) Oral 102 18 100 %  09/01/13 2100 135/76 mmHg 99.8 F (37.7 C) Oral 90 18 100 %  09/01/13 1414 139/87 mmHg 98.6 F (37 C) Oral 73 16 100 %  09/01/13 1200 - - - - 18 99 %     Recent laboratory studies:  Recent Labs  09/01/13 0512 09/02/13 0528  WBC 9.3 9.8  HGB 12.2*  12.3*  HCT 35.5* 35.5*  PLT 212 216  NA 132*  --   K 3.9  --   CL 94*  --   CO2 26  --   BUN 10  --   CREATININE 0.73  --   GLUCOSE 159*  --   CALCIUM 8.7  --      Discharge Medications:     Medication List    STOP taking these medications       BC FAST PAIN RELIEF 650-195-33.3 MG Pack  Generic drug:  Aspirin-Salicylamide-Caffeine      TAKE these medications       aspirin 325 MG EC tablet  Take 1 tablet (325 mg total) by mouth 2 (two) times daily after a meal.     cholecalciferol 1000 UNITS tablet  Commonly known as:  VITAMIN D  Take 1,000 Units by mouth daily.     fish oil-omega-3 fatty acids 1000 MG capsule  Take 1 g by mouth daily.     methocarbamol 500 MG tablet  Commonly known as:  ROBAXIN  Take 1 tablet (500 mg total) by mouth every 6 (six) hours as needed.     multivitamin with minerals Tabs tablet  Take 1 tablet by mouth daily.     oxyCODONE-acetaminophen 5-325 MG per tablet  Commonly known as:  ROXICET  Take 1-2 tablets by mouth every 4 (four) hours as  needed for pain.        Diagnostic Studies: Dg Hip Complete Left  08/31/2013   CLINICAL DATA:  Total left hip replacement.  EXAM: LEFT HIP - COMPLETE 2+ VIEW  COMPARISON:  None.  FINDINGS: Four intraoperative images are submitted. The 1st 2 images demonstrate collapse of the left femoral head. A shallow left acetabulum is evident. The 2nd 2 images are AP images of the left hip and pelvis. The patient is status post left total hip arthroplasty. The acetabular and femoral components appear well seated. The femoral head is localized over the acetabular component.  IMPRESSION: 1. Status post left total hip arthroplasty without radiographic evidence for complication.   Electronically Signed   By: Gennette Pac   On: 08/31/2013 13:55   Dg C-arm 1-60 Min-no Report  08/31/2013   CLINICAL DATA: osteoarthritis left hip   C-ARM 1-60 MINUTES  Fluoroscopy was utilized by the requesting physician.  No radiographic   interpretation.     Disposition:  To home      Discharge Orders   Future Orders Complete By Expires   Call MD / Call 911  As directed    Comments:     If you experience chest pain or shortness of breath, CALL 911 and be transported to the hospital emergency room.  If you develope a fever above 101 F, pus (white drainage) or increased drainage or redness at the wound, or calf pain, call your surgeon's office.   Constipation Prevention  As directed    Comments:     Drink plenty of fluids.  Prune juice may be helpful.  You may use a stool softener, such as Colace (over the counter) 100 mg twice a day.  Use MiraLax (over the counter) for constipation as needed.   Diet - low sodium heart healthy  As directed    Discharge instructions  As directed    Comments:     Increase your activities as comfort allows. You can get your actual dressing wet in the shower. You can remove your dressing this coming Thursday 10/2 and get your actual incision wet. New dry dressing daily starting 10/2 Ice for swelling   Discharge patient  As directed    Increase activity slowly as tolerated  As directed       Follow-up Information   Follow up with Kathryne Hitch, MD In 2 weeks.   Specialty:  Orthopedic Surgery   Contact information:   [address] [place] [address] [phone]        Signed: Kathryne Hitch 09/02/2013, 10:14 AM

## 2013-09-03 ENCOUNTER — Encounter (HOSPITAL_COMMUNITY): Payer: Self-pay | Admitting: Orthopaedic Surgery

## 2013-09-03 NOTE — Progress Notes (Signed)
Discharge summary sent to payer through [facility]  

## 2014-06-27 ENCOUNTER — Ambulatory Visit: Payer: Self-pay

## 2015-07-10 IMAGING — CR DG HIP COMPLETE 2+V*L*
1 series · 2 of 2 positions shown · non-contrast
Comparison: none

REASON FOR EXAM: Fax results [id number] L hip pain
COMMENTS:

PROCEDURE:     KDR - KDXR HIP LEFT COMPLETE  - July 18, 2013 [DATE]
RESULT:     AP and lateral views of the left hip reveal deformity of the
subcapital region that is of uncertain age. There is narrowing of the joint
space. The intertrochanteric and subtrochanteric regions appear normal.

[Series 1: ap · 0.17mm/px · 2 of 2 slices shown]
[im 1/2]
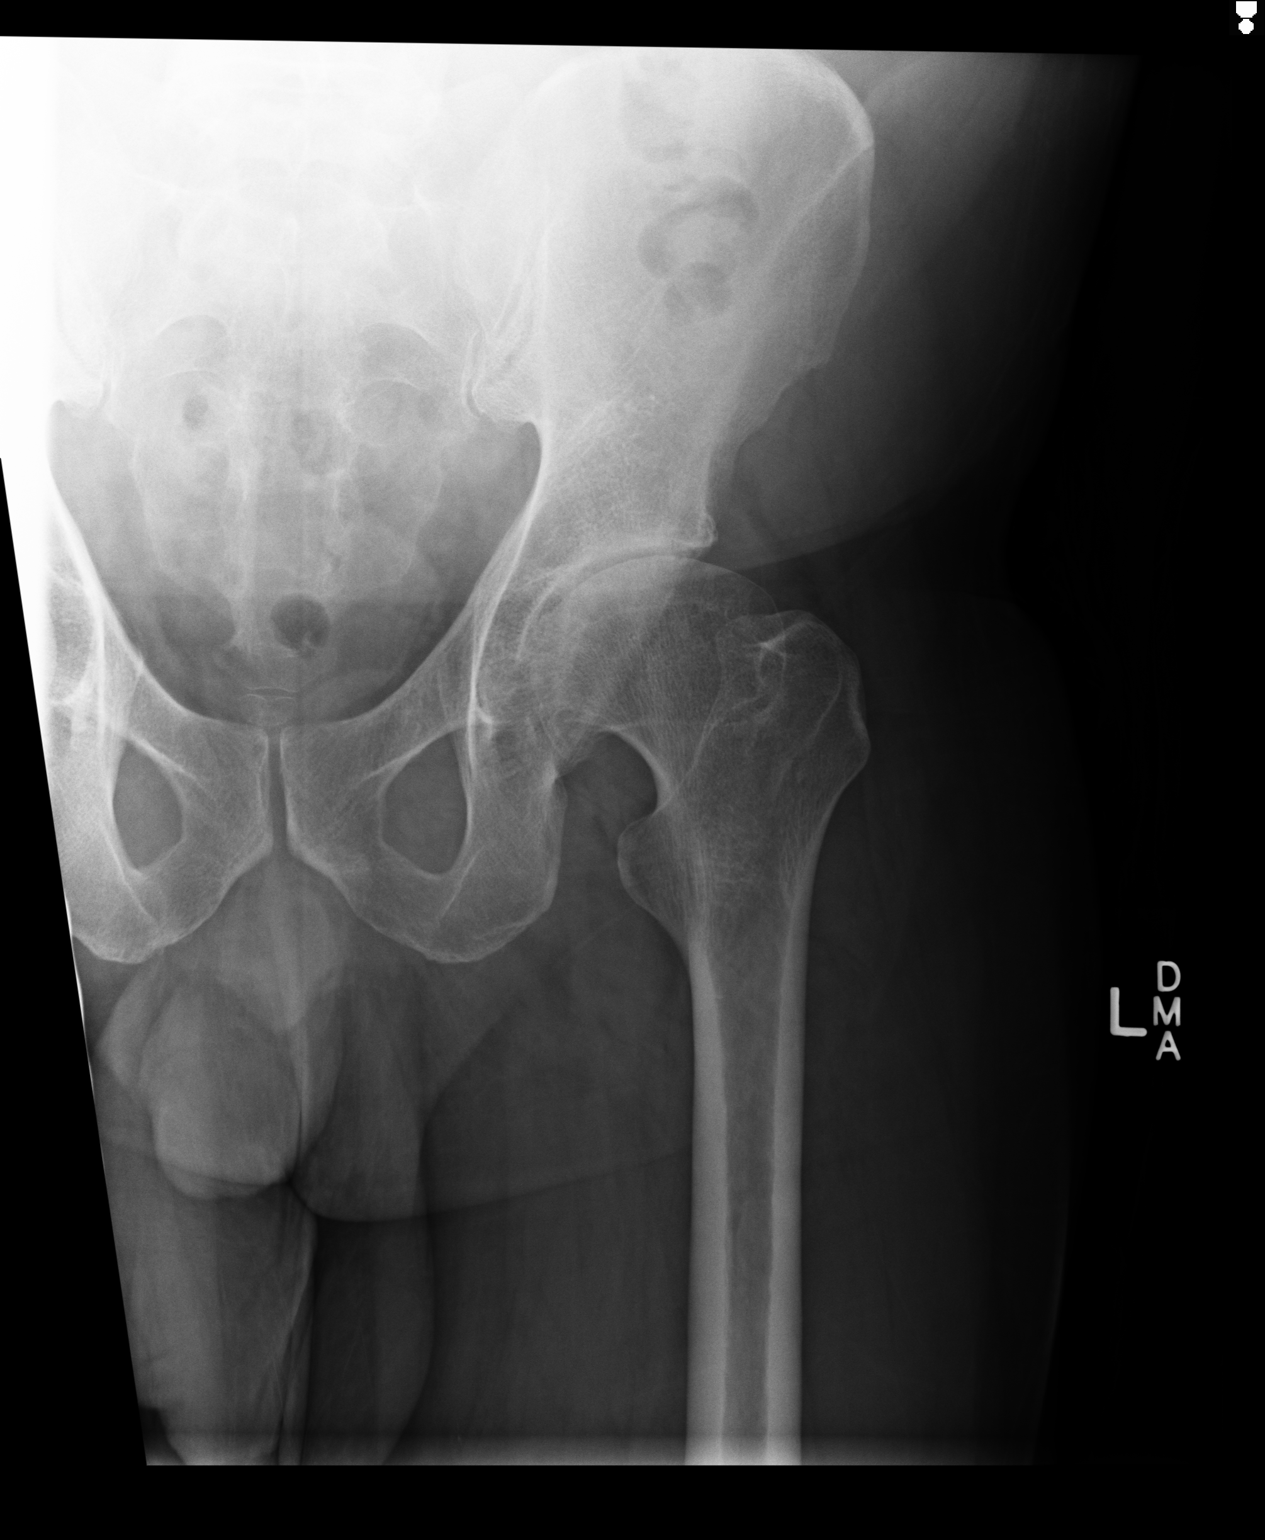
[im 2/2]
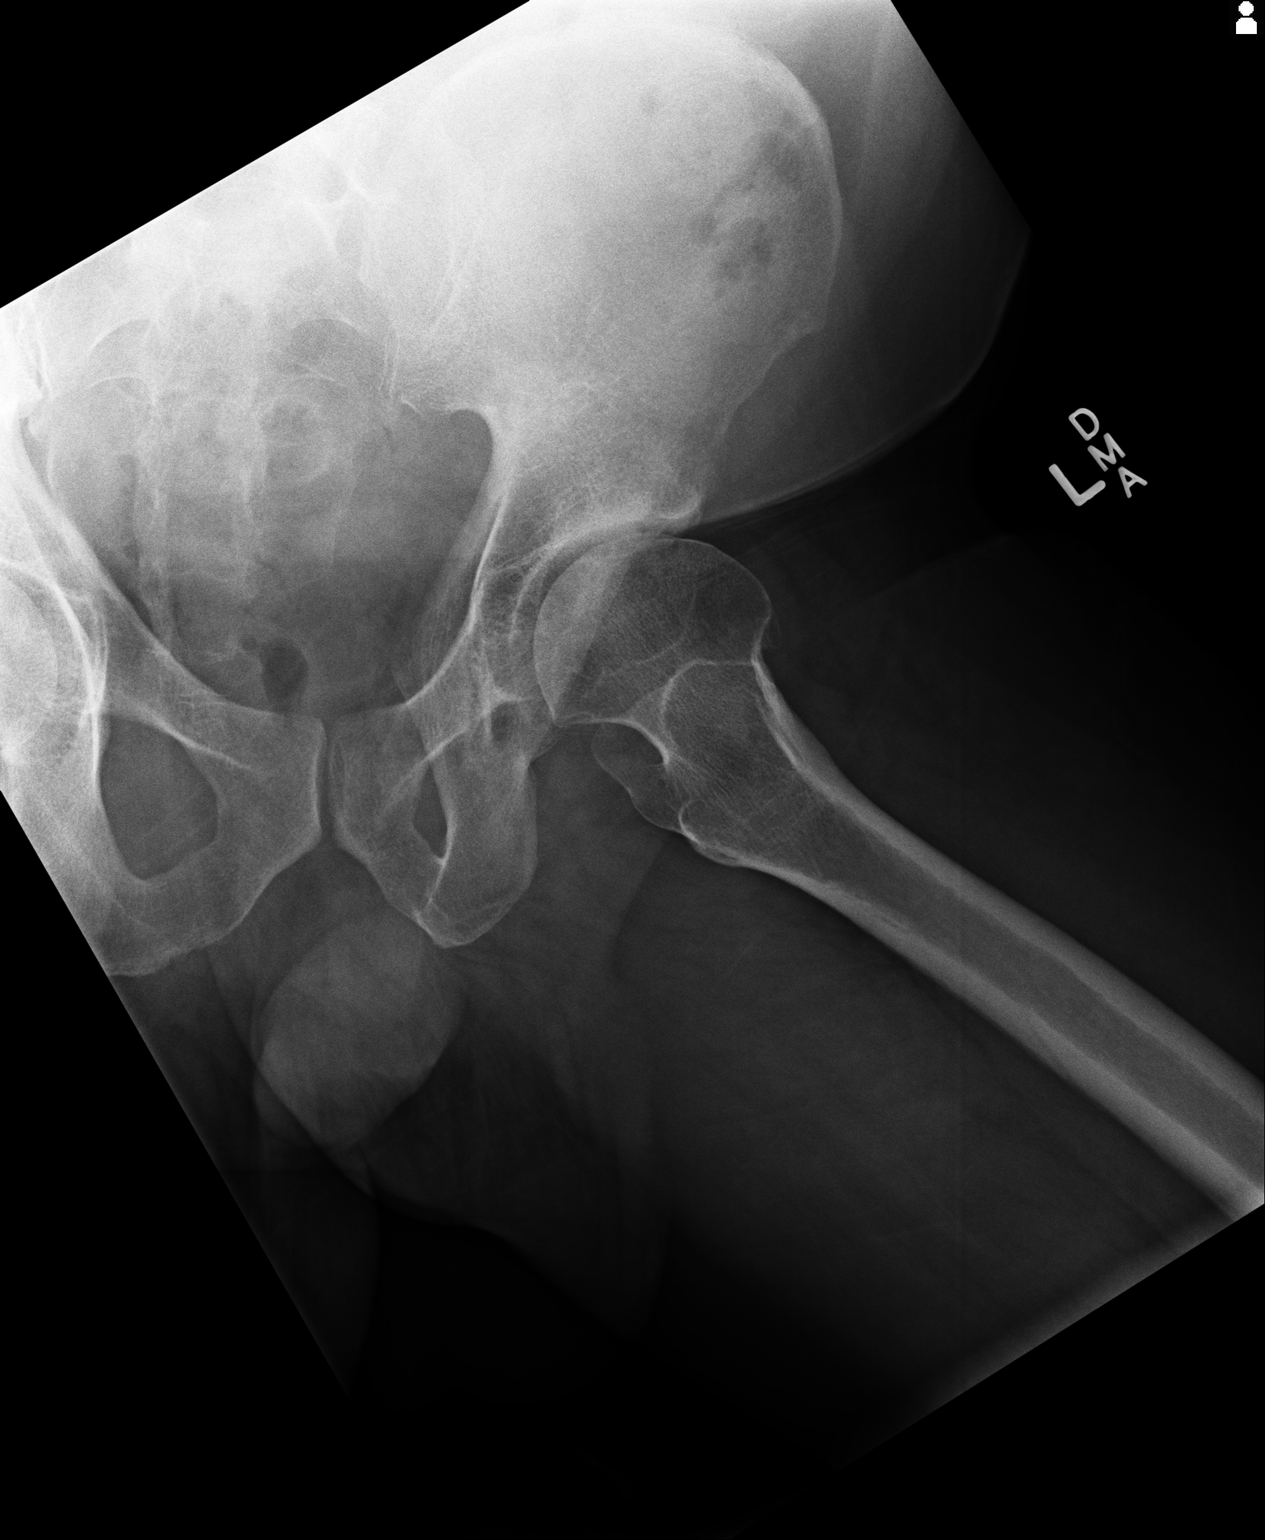

[2 of 2 positions shown; findings below may reference images not displayed]

IMPRESSION: There is no definite fracture line but there is deformity
of the junction of the femoral head with the neck in the subcapital region
that may reflect a previous fracture or an impacted acute fracture. If there
is a history of acute injury, CT scanning would be the most useful next
step.

[REDACTED]

## 2016-03-26 ENCOUNTER — Encounter: Payer: Self-pay | Admitting: Physician Assistant

## 2016-03-26 ENCOUNTER — Ambulatory Visit: Payer: Self-pay | Admitting: Physician Assistant

## 2016-03-26 VITALS — BP 130/85 | HR 85 | Temp 98.5°F

## 2016-03-26 DIAGNOSIS — G894 Chronic pain syndrome: Secondary | ICD-10-CM

## 2016-03-26 NOTE — Progress Notes (Signed)
S: would like a referal to someone that handles chronic arthritis, had his hip replaced to adjust the length of his leg, now has been having chronic pain in hip to back area, works [employer] and is having difficulty climbing ladders and crawling under houses, is taking at least 3 bc powders a day and just gets through the other pain as doesn't want to take heavy pain meds; states the pain is crippling when it happens and brings him to his knees, certain movements will cause the pain, can't sit on hard chairs, can't do a lot of bending or lifting, definitely can't crawl under houses without causing pain, just doesn't know what to do at this point, denies numbness /tingling or new injury to back/hip  O: Vitals wnl, nad, skin intact no bruising, r side of back and si joint tender, decreased rom with forward flexion, neg slr, hips are nontender at this time, n/v intact  A: chronic pain  P: will discuss with dr Sullivan Lonegilbert on which doctor is most appropriate for handling chronic osteo arthritis and will refer at that time

## 2018-02-02 ENCOUNTER — Ambulatory Visit: Payer: Self-pay | Admitting: Family Medicine

## 2018-02-02 VITALS — BP 118/72 | HR 95 | Temp 98.4°F | Resp 18

## 2018-02-02 DIAGNOSIS — J0100 Acute maxillary sinusitis, unspecified: Secondary | ICD-10-CM

## 2018-02-02 MED ORDER — AMOXICILLIN-POT CLAVULANATE 875-125 MG PO TABS: 1.0000 | ORAL_TABLET | Freq: Two times a day (BID) | ORAL | 0 refills | Status: AC

## 2018-02-02 NOTE — Progress Notes (Signed)
Subjective: Congestion     Jeff KingfisherJames Q Bonadonna is a 57 y.o. male who presents for evaluation of nasal congestion with green sputum, headache, sore throat, feels like his throat is dry, productive cough green sputum.  Patient reports that starting 2 weeks ago he had symptoms of an upper respiratory infection and a significant cough but that he was improving up until this past weekend.  Patient reports this weekend he began to develop chills, worsening of congestion, facial pressure.  Reports that the cough has almost completely resolved.  He has not taken his temperature at home.  Denies history of lung disease, frequent lung or sinus infections.  Has not attempted any treatment at home.  Denies rash, nausea, vomiting, diarrhea, shortness of breath, wheezing, chest or back pain, ear pain, difficulty swallowing, confusion, body aches, fatigue, known fever, sneezing, ocular pruritus/discharge.  Reports feeling like his symptoms have been gradually worsening since the onset of chills this weekend.  Review of Systems Pertinent items noted in HPI and remainder of comprehensive ROS otherwise negative.     Objective:   Physical Exam General: Awake, alert, and oriented. No acute distress. Well developed, hydrated and nourished. Appears stated age.  Nontoxic appearance HEENT: PND noted. No erythema, edema or exudates of pharynx or tonsils. No erythema or bulging of TM. Mild erythema/edema to nasal mucosa.  Tenderness over left maxillary sinus.  Remainder of sinuses nontender. Supple neck without adenopathy. Cardiac: Heart rate and rhythm are normal. No murmurs, gallops, or rubs are auscultated. S1 and S2 are heard and are of normal intensity.  Respiratory: No signs of respiratory distress. Lungs clear. No tachypnea. Able to speak in full sentences without dyspnea.  Respirations nonlabored at 16/min. Skin: Skin is warm, dry and intact. Appropriate color for ethnicity. No cyanosis noted.    Assessment:    sinusitis   Plan:    Discussed the diagnosis and treatment of sinusitis. Discussed the importance of avoiding unnecessary antibiotic therapy. Suggested symptomatic OTC remedies. Nasal saline spray for congestion.   Prescribed augmentin and informed patient to fill this medication if he has no improvement in his symptoms by Monday or if symptoms worsen. Discussed red flag symptoms and circumstances with which to return to care.   New Prescriptions   AMOXICILLIN-CLAVULANATE (AUGMENTIN) 875-125 MG TABLET    Take 1 tablet by mouth 2 (two) times daily for 10 days.
